# Patient Record
Sex: Male | Born: 1975 | Race: White | Hispanic: No | Marital: Married | Smoking: Never smoker
Health system: Southern US, Community
[De-identification: ages and names within clinical notes are randomized; demographics above are authoritative.]

---

## 2018-03-03 ENCOUNTER — Emergency Department (HOSPITAL_BASED_OUTPATIENT_CLINIC_OR_DEPARTMENT_OTHER)
Admission: EM | Admit: 2018-03-03 | Discharge: 2018-03-04 | Disposition: A | Discharge status: Home / Self Care | Payer: No Typology Code available for payment source | Attending: Emergency Medicine | Admitting: Emergency Medicine

## 2018-03-03 ENCOUNTER — Emergency Department (HOSPITAL_BASED_OUTPATIENT_CLINIC_OR_DEPARTMENT_OTHER): Payer: No Typology Code available for payment source

## 2018-03-03 ENCOUNTER — Encounter (HOSPITAL_BASED_OUTPATIENT_CLINIC_OR_DEPARTMENT_OTHER): Payer: Self-pay | Admitting: *Deleted

## 2018-03-03 ENCOUNTER — Other Ambulatory Visit: Payer: Self-pay

## 2018-03-03 DIAGNOSIS — Y998 Other external cause status: Secondary | ICD-10-CM | POA: Diagnosis not present

## 2018-03-03 DIAGNOSIS — Y92410 Unspecified street and highway as the place of occurrence of the external cause: Secondary | ICD-10-CM | POA: Insufficient documentation

## 2018-03-03 DIAGNOSIS — S199XXA Unspecified injury of neck, initial encounter: Secondary | ICD-10-CM | POA: Diagnosis present

## 2018-03-03 DIAGNOSIS — Y9389 Activity, other specified: Secondary | ICD-10-CM | POA: Diagnosis not present

## 2018-03-03 DIAGNOSIS — S161XXA Strain of muscle, fascia and tendon at neck level, initial encounter: Secondary | ICD-10-CM | POA: Insufficient documentation

## 2018-03-03 MED ORDER — HYDROCODONE-ACETAMINOPHEN 5-325 MG PO TABS
1.0000 | ORAL_TABLET | Freq: Once | ORAL | Status: AC
  Administered 2018-03-03: 1 via ORAL
  Filled 2018-03-03: qty 1

## 2018-03-03 MED ORDER — METHOCARBAMOL 500 MG PO TABS
1000.0000 mg | ORAL_TABLET | Freq: Once | ORAL | Status: AC
  Administered 2018-03-03: 1000 mg via ORAL
  Filled 2018-03-03: qty 2
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
## 2018-03-03 NOTE — ED Notes (Signed)
Patient transported to CT 
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
## 2018-03-03 NOTE — ED Notes (Signed)
ED Provider at bedside. 

## 2018-03-04 MED ORDER — METHOCARBAMOL 500 MG PO TABS: 500.0000 mg | ORAL_TABLET | Freq: Three times a day (TID) | ORAL | 0 refills | Status: AC | PRN

## 2018-03-04 MED ORDER — NAPROXEN 500 MG PO TABS: 500.0000 mg | ORAL_TABLET | Freq: Two times a day (BID) | ORAL | 0 refills | Status: AC

## 2018-03-04 NOTE — Discharge Instructions (Addendum)
Take the anti-inflammatories and muscle relaxers as prescribed.  Do not drive or operate machinery while taking the muscle relaxers.  Your blood pressure today is elevated and should follow-up with a primary doctor regarding this.  Return to the ED if develop new or worsening symptoms.
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
